# Patient Record
Sex: Male | Born: 1990 | Race: White | Hispanic: No | Marital: Single | Smoking: Current every day smoker
Health system: Southern US, Community
[De-identification: ages and names within clinical notes are randomized; demographics above are authoritative.]

## PROBLEM LIST (undated history)

## (undated) DIAGNOSIS — F419 Anxiety disorder, unspecified: Secondary | ICD-10-CM

---

## 2013-09-19 ENCOUNTER — Emergency Department (HOSPITAL_COMMUNITY): Payer: Self-pay

## 2013-09-19 ENCOUNTER — Encounter (HOSPITAL_COMMUNITY): Payer: Self-pay | Admitting: Emergency Medicine

## 2013-09-19 ENCOUNTER — Emergency Department (HOSPITAL_COMMUNITY)
Admission: EM | Admit: 2013-09-19 | Discharge: 2013-09-19 | Disposition: A | Discharge status: Home / Self Care | Payer: Self-pay | Attending: Emergency Medicine | Admitting: Emergency Medicine

## 2013-09-19 DIAGNOSIS — Y9289 Other specified places as the place of occurrence of the external cause: Secondary | ICD-10-CM | POA: Insufficient documentation

## 2013-09-19 DIAGNOSIS — S60511A Abrasion of right hand, initial encounter: Secondary | ICD-10-CM

## 2013-09-19 DIAGNOSIS — W2209XA Striking against other stationary object, initial encounter: Secondary | ICD-10-CM | POA: Insufficient documentation

## 2013-09-19 DIAGNOSIS — IMO0002 Reserved for concepts with insufficient information to code with codable children: Secondary | ICD-10-CM | POA: Insufficient documentation

## 2013-09-19 DIAGNOSIS — F17200 Nicotine dependence, unspecified, uncomplicated: Secondary | ICD-10-CM | POA: Insufficient documentation

## 2013-09-19 DIAGNOSIS — Y9389 Activity, other specified: Secondary | ICD-10-CM | POA: Insufficient documentation

## 2013-09-19 MED ORDER — BACITRACIN-NEOMYCIN-POLYMYXIN 400-5-5000 EX OINT
TOPICAL_OINTMENT | Freq: Once | CUTANEOUS | Status: AC
  Administered 2013-09-19: 1 via TOPICAL
  Filled 2013-09-19: qty 1

## 2013-09-19 MED ORDER — IBUPROFEN 600 MG PO TABS: 600.0000 mg | ORAL_TABLET | Freq: Four times a day (QID) | ORAL | Status: AC | PRN

## 2013-09-19 MED ORDER — IBUPROFEN 800 MG PO TABS
800.0000 mg | ORAL_TABLET | Freq: Once | ORAL | Status: AC
  Administered 2013-09-19: 800 mg via ORAL
  Filled 2013-09-19: qty 1

## 2013-09-19 MED ORDER — POVIDONE-IODINE 10 % EX SOLN
CUTANEOUS | Status: AC
  Filled 2013-09-19: qty 118

## 2013-09-19 NOTE — ED Notes (Addendum)
Pt states he was upset today and passed out.  Once he woke up he punched a tree.  States he just wants his right hand looked at.

## 2013-09-19 NOTE — Discharge Instructions (Signed)
Abrasion An abrasion is a cut or scrape of the skin. Abrasions do not extend through all layers of the skin and most heal within 10 days. It is important to care for your abrasion properly to prevent infection. CAUSES  Most abrasions are caused by falling on, or gliding across, the ground or other surface. When your skin rubs on something, the outer and inner layer of skin rubs off, causing an abrasion. DIAGNOSIS  Your caregiver will be able to diagnose an abrasion during a physical exam.  TREATMENT  Your treatment depends on how large and deep the abrasion is. Generally, your abrasion will be cleaned with water and a mild soap to remove any dirt or debris. An antibiotic ointment may be put over the abrasion to prevent an infection. A bandage (dressing) may be wrapped around the abrasion to keep it from getting dirty.  You may need a tetanus shot if:  You cannot remember when you had your last tetanus shot.  You have never had a tetanus shot.  The injury broke your skin. If you get a tetanus shot, your arm may swell, get red, and feel warm to the touch. This is common and not a problem. If you need a tetanus shot and you choose not to have one, there is a rare chance of getting tetanus. Sickness from tetanus can be serious.  HOME CARE INSTRUCTIONS   If a dressing was applied, change it at least once a day or as directed by your caregiver. If the bandage sticks, soak it off with warm water.   Wash the area with water and a mild soap to remove all the ointment 2 times a day. Rinse off the soap and pat the area dry with a clean towel.   Reapply any ointment as directed by your caregiver. This will help prevent infection and keep the bandage from sticking. Use gauze over the wound and under the dressing to help keep the bandage from sticking.   Change your dressing right away if it becomes wet or dirty.   Only take over-the-counter or prescription medicines for pain, discomfort, or fever as  directed by your caregiver.   Follow up with your caregiver within 24 48 hours for a wound check, or as directed. If you were not given a wound-check appointment, look closely at your abrasion for redness, swelling, or pus. These are signs of infection. SEEK IMMEDIATE MEDICAL CARE IF:   You have increasing pain in the wound.   You have redness, swelling, or tenderness around the wound.   You have pus coming from the wound.   You have a fever or persistent symptoms for more than 2 3 days.  You have a fever and your symptoms suddenly get worse.  You have a bad smell coming from the wound or dressing.  MAKE SURE YOU:   Understand these instructions.  Will watch your condition.  Will get help right away if you are not doing well or get worse. Document Released: 04/21/2005 Document Revised: 06/28/2012 Document Reviewed: 06/15/2011 [facility] Patient Information 2014 [place], [place].   Wash your hand in mild soap and water twice daily,  Then reapply another layer of antibiotic ointment (neosporin or triple antibiotic ointment).  Keep covered with telfa dressing (or other non stick dressing) until you have a good scab formed on your wounds.  Return here for any worsening symptoms such as increased pain, swelling, redness or drainage of pus, all signs of possible infection.  Your x-rays are negative  for broken bones.  An ice pack to your hand and elevation will also help with pain and swelling.

## 2013-09-21 NOTE — ED Provider Notes (Signed)
CSN: [id number]     Arrival date & time 09/19/13  1941 History   First MD Initiated Contact with Patient 09/19/13 2016     Chief Complaint  Patient presents with  . Hand Pain     (Consider location/radiation/quality/duration/timing/severity/associated sxs/prior Treatment) HPI Comments: Ryan Leon is a 23 y.o. Male presenting with right hand pain and abrasion sustained earlier today when he punched a tree.  He reports sitting in his home when he had a verbal altercation with family members. He became very angry,  Micah Flesher outside, although doesn't remember doing this and punched a tree out of anger.  Upon first arrival,  He stated in triage he passed out he was so upset, but now denies loc or syncope. Girlfriend at bedside who witnessed the event confirms he did not pass out.  He has pain, swelling and abrasions across his 3rd, 4th and 5th knuckles. Pain is worsened with attempts to form a fist.  He denies numbness in his fingertips and there is no radiation of pain into his forearm.  He is utd with his tetanus.  He denies any treatment prior to arrival.     The history is provided by the patient.    History reviewed. No pertinent past medical history. History reviewed. No pertinent past surgical history. History reviewed. No pertinent family history. History  Substance Use Topics  . Smoking status: Current Every Day Smoker  . Smokeless tobacco: Not on file  . Alcohol Use: No    Review of Systems  Constitutional: Negative for fever.  Musculoskeletal: Positive for arthralgias and joint swelling. Negative for myalgias.  Skin: Positive for wound.  Neurological: Negative for weakness and numbness.      Allergies  Review of patient's allergies indicates no known allergies.  Home Medications   Current Outpatient Rx  Name  Route  Sig  Dispense  Refill  . ibuprofen (ADVIL,MOTRIN) 600 MG tablet   Oral   Take 1 tablet (600 mg total) by mouth every 6 (six) hours as needed.   30  tablet   0    BP 141/79  Pulse 81  Temp(Src) 98.1 F (36.7 C) (Oral)  Resp 24  Ht 6\' 1"  (1.854 m)  Wt 155 lb (70.308 kg)  BMI 20.45 kg/m2  SpO2 100% Physical Exam  Constitutional: He appears well-developed and well-nourished.  HENT:  Head: Atraumatic.  Neck: Normal range of motion.  Cardiovascular:  Pulses equal bilaterally  Musculoskeletal: He exhibits tenderness.       Right hand: He exhibits tenderness and swelling. He exhibits normal two-point discrimination, normal capillary refill and no deformity. Normal sensation noted. Normal strength noted. He exhibits no finger abduction, no thumb/finger opposition and no wrist extension trouble.       Hands: Neurological: He is alert. He has normal strength. He displays normal reflexes. No sensory deficit.  Skin: Skin is warm and dry.  Psychiatric: He has a normal mood and affect.    ED Course  Procedures (including critical care time) Labs Review Labs Reviewed - No data to display Imaging Review No results found.   EKG Interpretation None      MDM   Final diagnoses:  Abrasion of right hand    Patients labs and/or radiological studies were viewed and considered during the medical decision making and disposition process. xrays discussed and shown patient.  Wounds were cleaned, neosporin and dressings applied.  Ibuprofen prescribed.  Encouraged ice, elevation.  Recheck for any signs of infection.  Prn f/u anticipated.  Burgess AmorJulie Vivianna Piccini, PA-C 09/21/13 2334

## 2013-10-02 NOTE — ED Provider Notes (Signed)
Medical screening examination/treatment/procedure(s) were performed by non-physician practitioner and as supervising physician I was immediately available for consultation/collaboration.   EKG Interpretation None        Krystina Strieter, MD 10/02/13 0951 

## 2014-08-02 ENCOUNTER — Encounter (HOSPITAL_COMMUNITY): Payer: Self-pay | Admitting: *Deleted

## 2014-08-02 ENCOUNTER — Emergency Department (HOSPITAL_COMMUNITY)
Admission: EM | Admit: 2014-08-02 | Discharge: 2014-08-02 | Disposition: A | Discharge status: Home / Self Care | Payer: Self-pay | Attending: Emergency Medicine | Admitting: Emergency Medicine

## 2014-08-02 ENCOUNTER — Emergency Department (HOSPITAL_COMMUNITY): Payer: Self-pay

## 2014-08-02 DIAGNOSIS — R079 Chest pain, unspecified: Secondary | ICD-10-CM | POA: Insufficient documentation

## 2014-08-02 DIAGNOSIS — Z720 Tobacco use: Secondary | ICD-10-CM | POA: Insufficient documentation

## 2014-08-02 DIAGNOSIS — R059 Cough, unspecified: Secondary | ICD-10-CM

## 2014-08-02 DIAGNOSIS — R05 Cough: Secondary | ICD-10-CM | POA: Insufficient documentation

## 2014-08-02 HISTORY — DX: Anxiety disorder, unspecified: F41.9

## 2014-08-02 MED ORDER — PREDNISONE 50 MG PO TABS
60.0000 mg | ORAL_TABLET | Freq: Once | ORAL | Status: AC
  Administered 2014-08-02: 60 mg via ORAL
  Filled 2014-08-02 (×2): qty 1

## 2014-08-02 MED ORDER — PREDNISONE 20 MG PO TABS: 60.0000 mg | ORAL_TABLET | Freq: Every day | ORAL | Status: AC

## 2014-08-02 MED ORDER — ALBUTEROL SULFATE HFA 108 (90 BASE) MCG/ACT IN AERS
2.0000 | INHALATION_SPRAY | RESPIRATORY_TRACT | Status: DC | PRN
  Administered 2014-08-02: 2 via RESPIRATORY_TRACT
  Filled 2014-08-02: qty 6.7

## 2014-08-02 MED ORDER — IPRATROPIUM-ALBUTEROL 0.5-2.5 (3) MG/3ML IN SOLN
3.0000 mL | Freq: Once | RESPIRATORY_TRACT | Status: AC
  Administered 2014-08-02: 3 mL via RESPIRATORY_TRACT
  Filled 2014-08-02: qty 3

## 2014-08-02 NOTE — Discharge Instructions (Signed)
Chest Pain (Nonspecific) °It is often hard to give a specific diagnosis for the cause of chest pain. There is always a chance that your pain could be related to something serious, such as a heart attack or a blood clot in the lungs. You need to follow up with your health care provider for further evaluation. °CAUSES  °· Heartburn. °· Pneumonia or bronchitis. °· Anxiety or stress. °· Inflammation around your heart (pericarditis) or lung (pleuritis or pleurisy). °· A blood clot in the lung. °· A collapsed lung (pneumothorax). It can develop suddenly on its own (spontaneous pneumothorax) or from trauma to the chest. °· Shingles infection (herpes zoster virus). °The chest wall is composed of bones, muscles, and cartilage. Any of these can be the source of the pain. °· The bones can be bruised by injury. °· The muscles or cartilage can be strained by coughing or overwork. °· The cartilage can be affected by inflammation and become sore (costochondritis). °DIAGNOSIS  °Lab tests or other studies may be needed to find the cause of your pain. Your health care provider may have you take a test called an ambulatory electrocardiogram (ECG). An ECG records your heartbeat patterns over a 24-hour period. You may also have other tests, such as: °· Transthoracic echocardiogram (TTE). During echocardiography, sound waves are used to evaluate how blood flows through your heart. °· Transesophageal echocardiogram (TEE). °· Cardiac monitoring. This allows your health care provider to monitor your heart rate and rhythm in real time. °· Holter monitor. This is a portable device that records your heartbeat and can help diagnose heart arrhythmias. It allows your health care provider to track your heart activity for several days, if needed. °· Stress tests by exercise or by giving medicine that makes the heart beat faster. °TREATMENT  °· Treatment depends on what may be causing your chest pain. Treatment may include: °· Acid blockers for  heartburn. °· Anti-inflammatory medicine. °· Pain medicine for inflammatory conditions. °· Antibiotics if an infection is present. °· You may be advised to change lifestyle habits. This includes stopping smoking and avoiding alcohol, caffeine, and chocolate. °· You may be advised to keep your head raised (elevated) when sleeping. This reduces the chance of acid going backward from your stomach into your esophagus. °Most of the time, nonspecific chest pain will improve within 2-3 days with rest and mild pain medicine.  °HOME CARE INSTRUCTIONS  °· If antibiotics were prescribed, take them as directed. Finish them even if you start to feel better. °· For the next few days, avoid physical activities that bring on chest pain. Continue physical activities as directed. °· Do not use any tobacco products, including cigarettes, chewing tobacco, or electronic cigarettes. °· Avoid drinking alcohol. °· Only take medicine as directed by your health care provider. °· Follow your health care provider's suggestions for further testing if your chest pain does not go away. °· Keep any follow-up appointments you made. If you do not go to an appointment, you could develop lasting (chronic) problems with pain. If there is any problem keeping an appointment, call to reschedule. °SEEK MEDICAL CARE IF:  °· Your chest pain does not go away, even after treatment. °· You have a rash with blisters on your chest. °· You have a fever. °SEEK IMMEDIATE MEDICAL CARE IF:  °· You have increased chest pain or pain that spreads to your arm, neck, jaw, back, or abdomen. °· You have shortness of breath. °· You have an increasing cough, or you cough   up blood.  You have severe back or abdominal pain.  You feel nauseous or vomit.  You have severe weakness.  You faint.  You have chills. This is an emergency. Do not wait to see if the pain will go away. Get medical help at once. Call your local emergency services (911 in [place] Do not drive  yourself to the hospital. MAKE SURE YOU:   Understand these instructions.  Will watch your condition.  Will get help right away if you are not doing well or get worse. Document Released: 04/21/2005 Document Revised: 07/17/2013 Document Reviewed: 02/15/2008 [facility] Patient Information 2015 [place], [place]. This information is not intended to replace advice given to you by your health care provider. Make sure you discuss any questions you have with your health care provider.  Cough, Adult  A cough is a reflex that helps clear your throat and airways. It can help heal the body or may be a reaction to an irritated airway. A cough may only last 2 or 3 weeks (acute) or may last more than 8 weeks (chronic).  CAUSES Acute cough:  Viral or bacterial infections. Chronic cough:  Infections.  Allergies.  Asthma.  Post-nasal drip.  Smoking.  Heartburn or acid reflux.  Some medicines.  Chronic lung problems (COPD).  Cancer. SYMPTOMS   Cough.  Fever.  Chest pain.  Increased breathing rate.  High-pitched whistling sound when breathing (wheezing).  Colored mucus that you cough up (sputum). TREATMENT   A bacterial cough may be treated with antibiotic medicine.  A viral cough must run its course and will not respond to antibiotics.  Your caregiver may recommend other treatments if you have a chronic cough. HOME CARE INSTRUCTIONS   Only take over-the-counter or prescription medicines for pain, discomfort, or fever as directed by your caregiver. Use cough suppressants only as directed by your caregiver.  Use a cold steam vaporizer or humidifier in your bedroom or home to help loosen secretions.  Sleep in a semi-upright position if your cough is worse at night.  Rest as needed.  Stop smoking if you smoke. SEEK IMMEDIATE MEDICAL CARE IF:   You have pus in your sputum.  Your cough starts to worsen.  You cannot control your cough with suppressants and are losing  sleep.  You begin coughing up blood.  You have difficulty breathing.  You develop pain which is getting worse or is uncontrolled with medicine.  You have a fever. MAKE SURE YOU:   Understand these instructions.  Will watch your condition.  Will get help right away if you are not doing well or get worse. Document Released: 01/08/2011 Document Revised: 10/04/2011 Document Reviewed: 01/08/2011 [facility] Patient Information 2015 [place], [place]. This information is not intended to replace advice given to you by your health care provider. Make sure you discuss any questions you have with your health care provider.  Albuterol inhalation aerosol What is this medicine? ALBUTEROL (al Gaspar Bidding) is a bronchodilator. It helps open up the airways in your lungs to make it easier to breathe. This medicine is used to treat and to prevent bronchospasm. This medicine may be used for other purposes; ask your health care provider or pharmacist if you have questions. COMMON BRAND NAME(S): Proair HFA, Proventil, Proventil HFA, Respirol, Ventolin, Ventolin HFA What should I tell my health care provider before I take this medicine? They need to know if you have any of the following conditions: -diabetes -heart disease or irregular heartbeat -high blood pressure -pheochromocytoma -seizures -  thyroid disease -an unusual or allergic reaction to albuterol, levalbuterol, sulfites, other medicines, foods, dyes, or preservatives -pregnant or trying to get pregnant -breast-feeding How should I use this medicine? This medicine is for inhalation through the mouth. Follow the directions on your prescription label. Take your medicine at regular intervals. Do not use more often than directed. Make sure that you are using your inhaler correctly. Ask you doctor or health care provider if you have any questions. Talk to your pediatrician regarding the use of this medicine in children. Special care may be  needed. Overdosage: If you think you have taken too much of this medicine contact a poison control center or emergency room at once. NOTE: This medicine is only for you. Do not share this medicine with others. What if I miss a dose? If you miss a dose, use it as soon as you can. If it is almost time for your next dose, use only that dose. Do not use double or extra doses. What may interact with this medicine? -anti-infectives like chloroquine and pentamidine -caffeine -cisapride -diuretics -medicines for colds -medicines for depression or for emotional or psychotic conditions -medicines for weight loss including some herbal products -methadone -some antibiotics like clarithromycin, erythromycin, levofloxacin, and linezolid -some heart medicines -steroid hormones like dexamethasone, cortisone, hydrocortisone -theophylline -thyroid hormones This list may not describe all possible interactions. Give your health care provider a list of all the medicines, herbs, non-prescription drugs, or dietary supplements you use. Also tell them if you smoke, drink alcohol, or use illegal drugs. Some items may interact with your medicine. What should I watch for while using this medicine? Tell your doctor or health care professional if your symptoms do not improve. Do not use extra albuterol. If your asthma or bronchitis gets worse while you are using this medicine, call your doctor right away. If your mouth gets dry try chewing sugarless gum or sucking hard candy. Drink water as directed. What side effects may I notice from receiving this medicine? Side effects that you should report to your doctor or health care professional as soon as possible: -allergic reactions like skin rash, itching or hives, swelling of the face, lips, or tongue -breathing problems -chest pain -feeling faint or lightheaded, falls -high blood pressure -irregular heartbeat -fever -muscle cramps or weakness -pain, tingling,  numbness in the hands or feet -vomiting Side effects that usually do not require medical attention (report to your doctor or health care professional if they continue or are bothersome): -cough -difficulty sleeping -headache -nervousness or trembling -stomach upset -stuffy or runny nose -throat irritation -unusual taste This list may not describe all possible side effects. Call your doctor for medical advice about side effects. You may report side effects to FDA at 1-800-FDA-1088. Where should I keep my medicine? Keep out of the reach of children. Store at room temperature between 15 and 30 degrees C (59 and 86 degrees F). The contents are under pressure and may burst when exposed to heat or flame. Do not freeze. This medicine does not work as well if it is too cold. Throw away any unused medicine after the expiration date. Inhalers need to be thrown away after the labeled number of puffs have been used or by the expiration date; whichever comes first. Ventolin HFA should be thrown away 12 months after removing from foil pouch. Check the instructions that come with your medicine. NOTE: This sheet is a summary. It may not cover all possible information. If you have questions about  this medicine, talk to your doctor, pharmacist, or health care provider.  2015, [employer] Standard. (2012-12-28 10:57:17)  Prednisone tablets What is this medicine? PREDNISONE (PRED ni sone) is a corticosteroid. It is commonly used to treat inflammation of the skin, joints, lungs, and other organs. Common conditions treated include asthma, allergies, and arthritis. It is also used for other conditions, such as blood disorders and diseases of the adrenal glands. This medicine may be used for other purposes; ask your health care provider or pharmacist if you have questions. COMMON BRAND NAME(S): Deltasone, Predone, Sterapred, Sterapred DS What should I tell my health care provider before I take this medicine? They  need to know if you have any of these conditions: -Cushing's syndrome -diabetes -glaucoma -heart disease -high blood pressure -infection (especially a virus infection such as chickenpox, cold sores, or herpes) -kidney disease -liver disease -mental illness -myasthenia gravis -osteoporosis -seizures -stomach or intestine problems -thyroid disease -an unusual or allergic reaction to lactose, prednisone, other medicines, foods, dyes, or preservatives -pregnant or trying to get pregnant -breast-feeding How should I use this medicine? Take this medicine by mouth with a glass of water. Follow the directions on the prescription label. Take this medicine with food. If you are taking this medicine once a day, take it in the morning. Do not take more medicine than you are told to take. Do not suddenly stop taking your medicine because you may develop a severe reaction. Your doctor will tell you how much medicine to take. If your doctor wants you to stop the medicine, the dose may be slowly lowered over time to avoid any side effects. Talk to your pediatrician regarding the use of this medicine in children. Special care may be needed. Overdosage: If you think you have taken too much of this medicine contact a poison control center or emergency room at once. NOTE: This medicine is only for you. Do not share this medicine with others. What if I miss a dose? If you miss a dose, take it as soon as you can. If it is almost time for your next dose, talk to your doctor or health care professional. You may need to miss a dose or take an extra dose. Do not take double or extra doses without advice. What may interact with this medicine? Do not take this medicine with any of the following medications: -metyrapone -mifepristone This medicine may also interact with the following medications: -aminoglutethimide -amphotericin B -aspirin and aspirin-like medicines -barbiturates -certain medicines for  diabetes, like glipizide or glyburide -cholestyramine -cholinesterase inhibitors -cyclosporine -digoxin -diuretics -ephedrine -male hormones, like estrogens and birth control pills -isoniazid -ketoconazole -NSAIDS, medicines for pain and inflammation, like ibuprofen or naproxen -phenytoin -rifampin -toxoids -vaccines -warfarin This list may not describe all possible interactions. Give your health care provider a list of all the medicines, herbs, non-prescription drugs, or dietary supplements you use. Also tell them if you smoke, drink alcohol, or use illegal drugs. Some items may interact with your medicine. What should I watch for while using this medicine? Visit your doctor or health care professional for regular checks on your progress. If you are taking this medicine over a prolonged period, carry an identification card with your name and address, the type and dose of your medicine, and your doctor's name and address. This medicine may increase your risk of getting an infection. Tell your doctor or health care professional if you are around anyone with measles or chickenpox, or if you develop sores or blisters that  do not heal properly. If you are going to have surgery, tell your doctor or health care professional that you have taken this medicine within the last twelve months. Ask your doctor or health care professional about your diet. You may need to lower the amount of salt you eat. This medicine may affect blood sugar levels. If you have diabetes, check with your doctor or health care professional before you change your diet or the dose of your diabetic medicine. What side effects may I notice from receiving this medicine? Side effects that you should report to your doctor or health care professional as soon as possible: -allergic reactions like skin rash, itching or hives, swelling of the face, lips, or tongue -changes in emotions or moods -changes in vision -depressed  mood -eye pain -fever or chills, cough, sore throat, pain or difficulty passing urine -increased thirst -swelling of ankles, feet Side effects that usually do not require medical attention (report to your doctor or health care professional if they continue or are bothersome): -confusion, excitement, restlessness -headache -nausea, vomiting -skin problems, acne, thin and shiny skin -trouble sleeping -weight gain This list may not describe all possible side effects. Call your doctor for medical advice about side effects. You may report side effects to FDA at 1-800-FDA-1088. Where should I keep my medicine? Keep out of the reach of children. Store at room temperature between 15 and 30 degrees C (59 and 86 degrees F). Protect from light. Keep container tightly closed. Throw away any unused medicine after the expiration date. NOTE: This sheet is a summary. It may not cover all possible information. If you have questions about this medicine, talk to your doctor, pharmacist, or health care provider.  2015, [employer] Standard. (2011-02-25 10:57:14)

## 2014-08-02 NOTE — ED Notes (Signed)
Pt reporting chest pain in middle of chest for aprox 3 hours. Reporting some SOB and cough for "2-3 months.

## 2014-08-02 NOTE — ED Notes (Signed)
Respiratory called for a breathing treatment at this time. 

## 2014-08-02 NOTE — ED Provider Notes (Signed)
CSN: [id number]     Arrival date & time 08/02/14  0434 History   First MD Initiated Contact with Patient 08/02/14 [phone]     Chief Complaint  Patient presents with  . Chest Pain     (Consider location/radiation/quality/duration/timing/severity/associated sxs/prior Treatment) Patient is a 24 y.o. male presenting with chest pain. The history is provided by the patient.  Chest Pain He had an episode tonight where he was having severe coughing and on for a short of breath and felt like something was gripping his heart. That sensation has subsided but is still present. He states his pain was 9/10 at its worst and is down to 2/10 currently. That chest discomfort is worse with deep breathing and with exertion. He has been coughing for about the last 3 months. Cough is productive of thick green sputum. He denies fever or chills. He is a cigarette smoker but has cut back from 1 pack a day to less than half a pack. He does have history of anxiety attacks, but this episode is different from what he has had before.  No past medical history on file. No past surgical history on file. No family history on file. History  Substance Use Topics  . Smoking status: Current Every Day Smoker  . Smokeless tobacco: Not on file  . Alcohol Use: No    Review of Systems  Cardiovascular: Positive for chest pain.  All other systems reviewed and are negative.     Allergies  Review of patient's allergies indicates no known allergies.  Home Medications   Prior to Admission medications   Medication Sig Start Date End Date Taking? Authorizing Provider  ibuprofen (ADVIL,MOTRIN) 600 MG tablet Take 1 tablet (600 mg total) by mouth every 6 (six) hours as needed. 09/19/13   Burgess AmorJulie Idol, PA-C   BP 143/76 mmHg  Pulse 85  Temp(Src) 98.5 F (36.9 C) (Oral)  Resp 16  Ht 6' (1.829 m)  Wt 155 lb (70.308 kg)  BMI 21.02 kg/m2  SpO2 100% Physical Exam  Nursing note and vitals reviewed.  24 year old male, resting  comfortably and in no acute distress. Vital signs are significant for borderline hypertension. Oxygen saturation is 100%, which is normal. Head is normocephalic and atraumatic. PERRLA, EOMI. Oropharynx is clear. Neck is nontender and supple without adenopathy or JVD. Back is nontender and there is no CVA tenderness. Lungs are clear without rales, wheezes, or rhonchi. Chest is nontender. Heart has regular rate and rhythm without murmur. Abdomen is soft, flat, nontender without masses or hepatosplenomegaly and peristalsis is normoactive. Extremities have no cyanosis or edema, full range of motion is present. Skin is warm and dry without rash. Neurologic: Mental status is normal, cranial nerves are intact, there are no motor or sensory deficits.  ED Course  Procedures (including critical care time) Imaging Review Dg Chest 2 View  08/02/2014   CLINICAL DATA:  Chest pain beginning this morning.  EXAM: CHEST  2 VIEW  COMPARISON:  None.  FINDINGS: Cardiomediastinal silhouette is unremarkable. The lungs are clear without pleural effusions or focal consolidations. Trachea projects midline and there is no pneumothorax. Soft tissue planes and included osseous structures are non-suspicious.  IMPRESSION: Normal chest.   Electronically Signed   By: Awilda Metroourtnay  Bloomer   On: 08/02/2014 05:22   Images viewed by me.   EKG Interpretation   Date/Time:  Friday August 02 2014 04:47:01 EST Ventricular Rate:  89 PR Interval:  119 QRS Duration: 99 QT Interval:  370 QTC  Calculation: 450 R Axis:   98 Text Interpretation:  Sinus rhythm Borderline short PR interval Borderline  right axis deviation RSR' in V1 or V2, probably normal variant Persistent  juvenile T wave pattern No old tracing to compare Confirmed by [facility]  MD,  Nalin Mazzocco ([id number]) on 08/02/2014 4:56:16 AM      MDM   Final diagnoses:  Cough  Chest pain, unspecified chest pain type    Persistent cough which is likely to be related to bronchospasm.  Episode of chest pain without any concerning risk factors other than cigarette smoking. Chest x-ray will be obtained as well as EKG no be given therapeutic trial of albuterol with ipratropium.  He has noticed some improvement in symptoms with albuterol with ipratropium. He is given a dose of prednisone. Chest x-ray shows no active disease. He is discharged with prescription for prednisone and is given an albuterol inhaler to take home. He is encouraged to stop smoking.  Dione Booze, MD 08/02/14 [phone]

## 2015-08-21 IMAGING — DX DG CHEST 2V
2 series · 2 of 2 positions shown · non-contrast
Comparison: None.

CLINICAL DATA: Chest pain beginning this morning.

EXAM:
CHEST  2 VIEW

[chest lat]
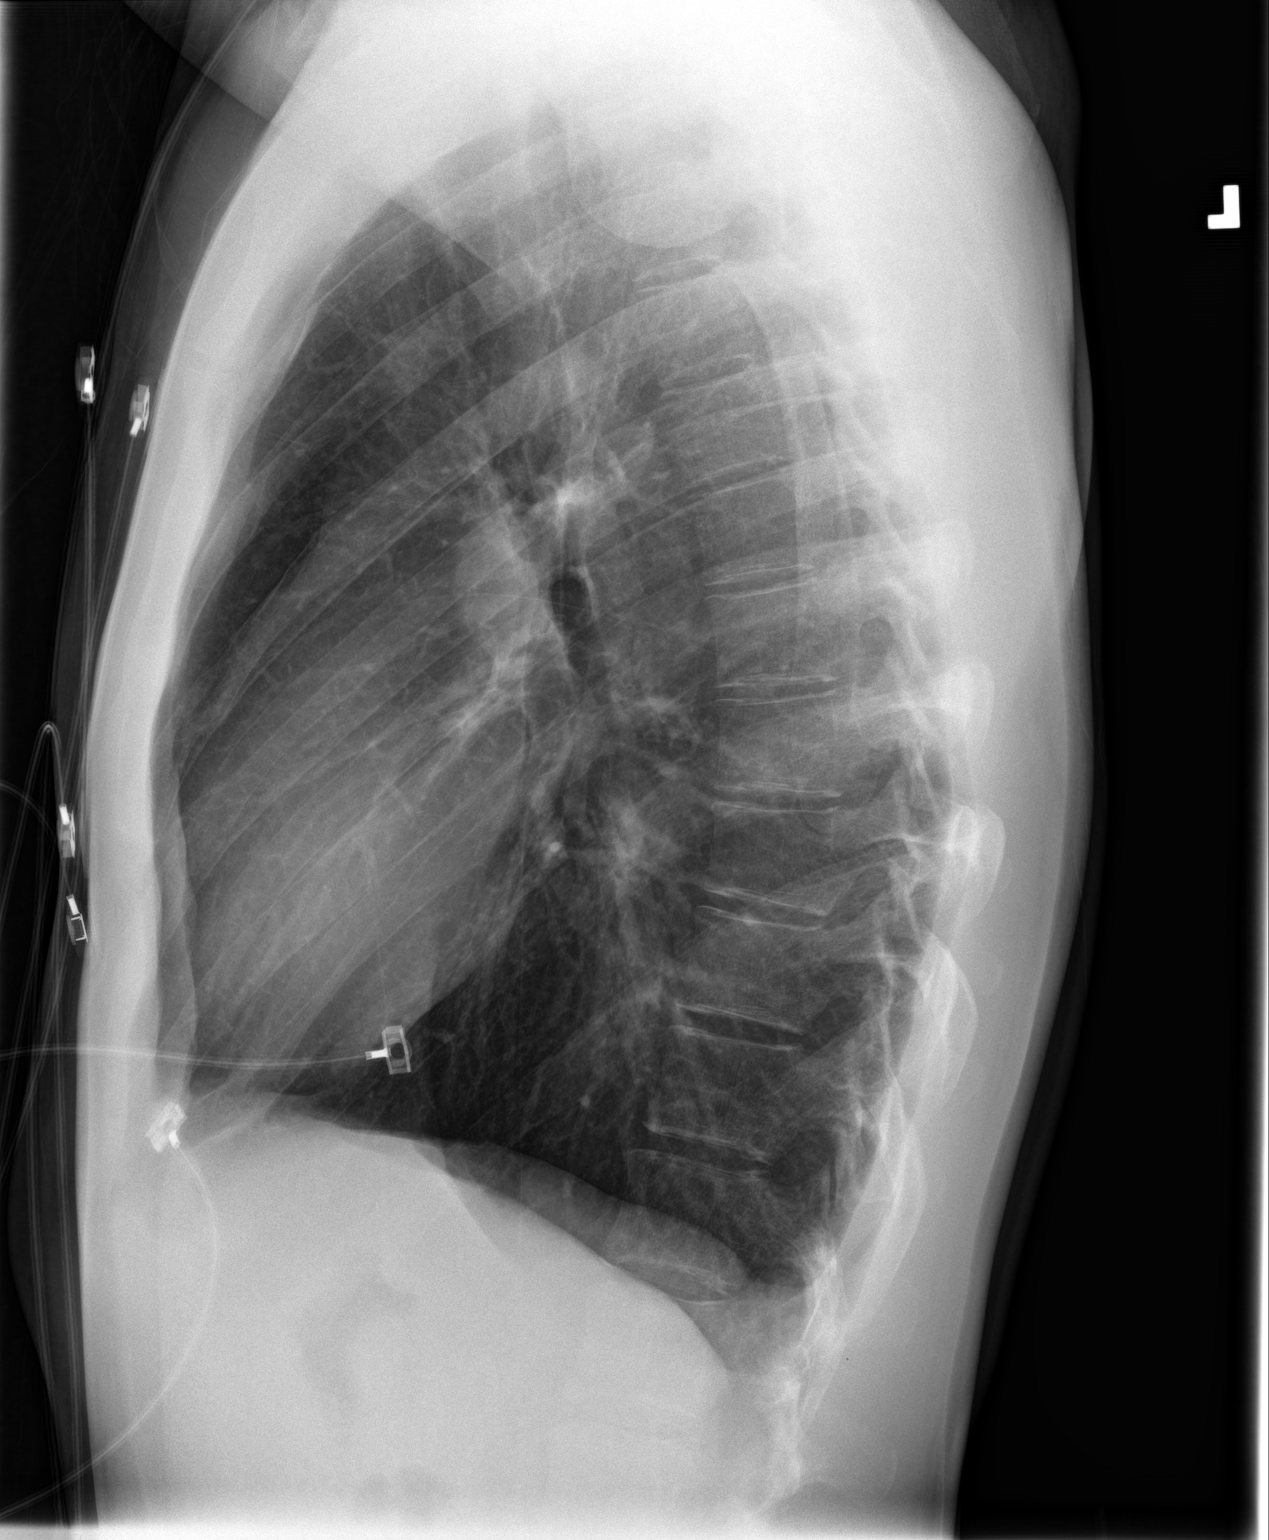

[chest ap]
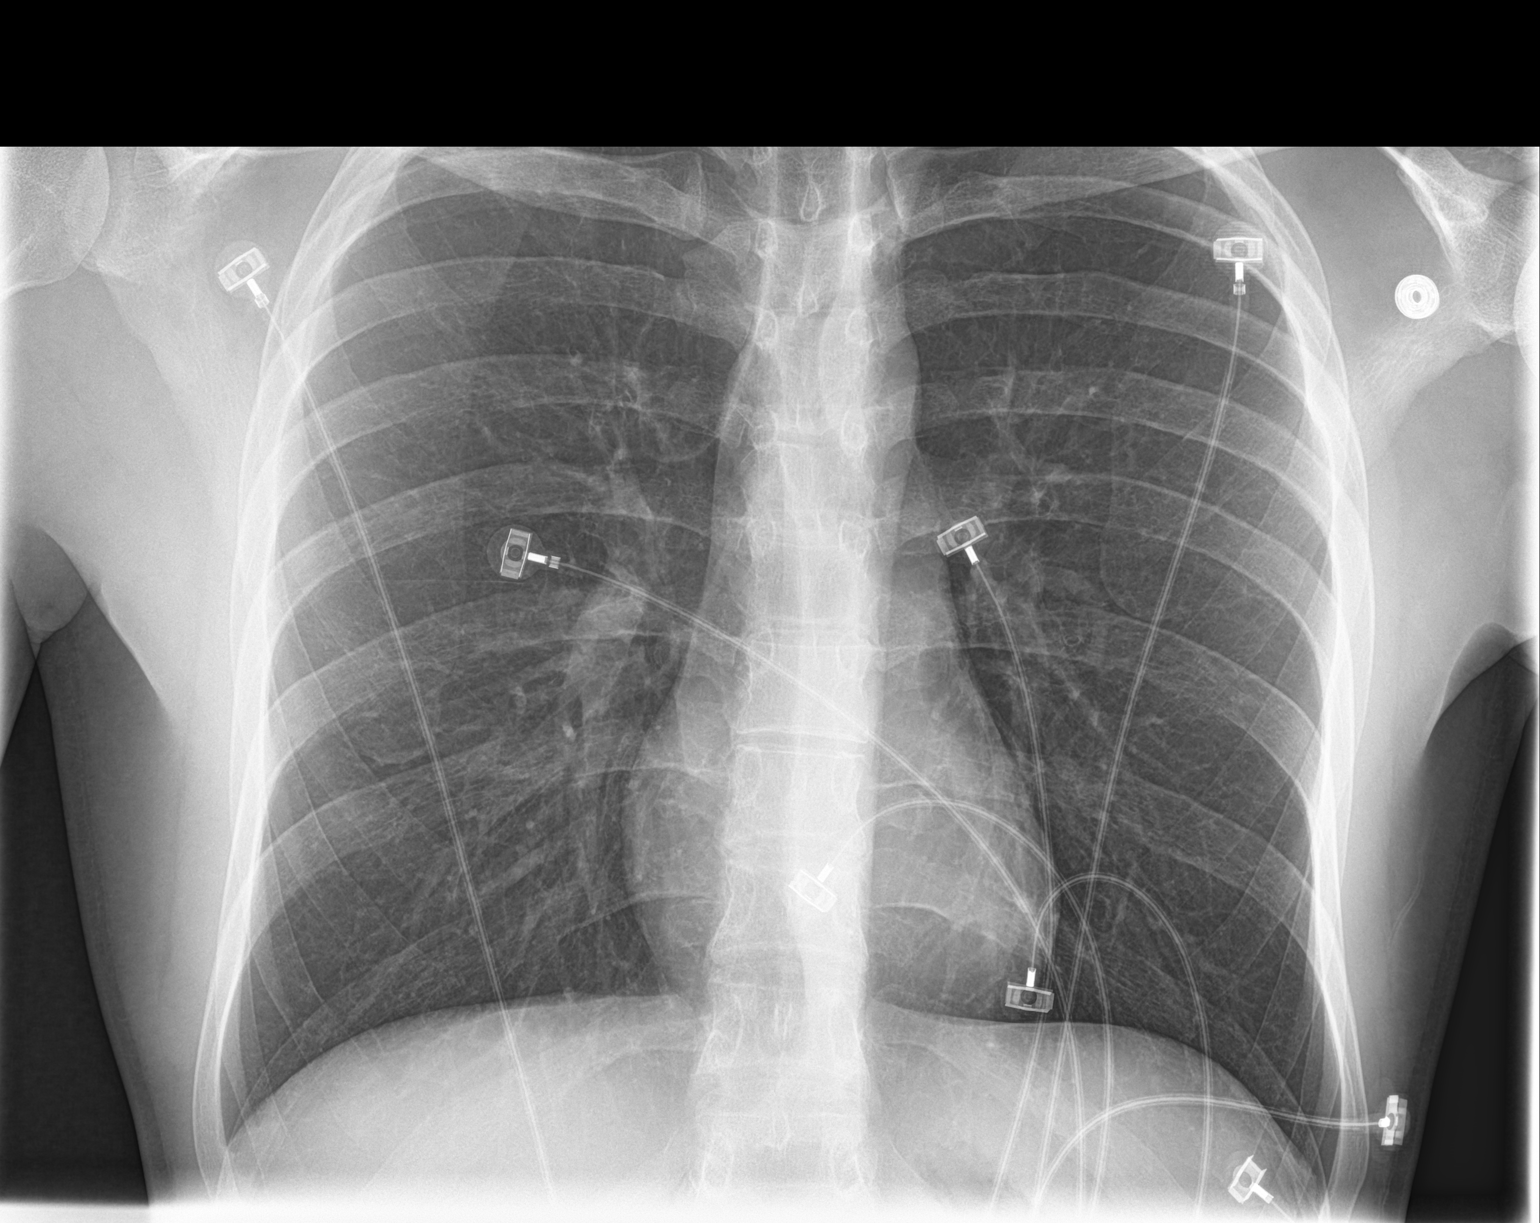

[2 of 2 positions shown; findings below may reference images not displayed]

FINDINGS: Cardiomediastinal silhouette is unremarkable. The lungs are clear
without pleural effusions or focal consolidations. Trachea projects
midline and there is no pneumothorax. Soft tissue planes and
included osseous structures are non-suspicious.
IMPRESSION: Normal chest.

  By: Lian Chaplin
